# Patient Record
Sex: Male | Born: 1965 | Race: White | Hispanic: No | Smoking: Never smoker
Health system: Southern US, Community
[De-identification: ages and names within clinical notes are randomized; demographics above are authoritative.]

## PROBLEM LIST (undated history)

## (undated) DIAGNOSIS — F32A Depression, unspecified: Secondary | ICD-10-CM

## (undated) DIAGNOSIS — M5137 Other intervertebral disc degeneration, lumbosacral region: Secondary | ICD-10-CM

## (undated) DIAGNOSIS — M51379 Other intervertebral disc degeneration, lumbosacral region without mention of lumbar back pain or lower extremity pain: Secondary | ICD-10-CM

## (undated) DIAGNOSIS — F329 Major depressive disorder, single episode, unspecified: Secondary | ICD-10-CM

## (undated) HISTORY — DX: Other intervertebral disc degeneration, lumbosacral region without mention of lumbar back pain or lower extremity pain: M51.379

## (undated) HISTORY — PX: APPENDECTOMY: SHX54

## (undated) HISTORY — DX: Depression, unspecified: F32.A

## (undated) HISTORY — DX: Major depressive disorder, single episode, unspecified: F32.9

## (undated) HISTORY — DX: Other intervertebral disc degeneration, lumbosacral region: M51.37

---

## 2010-07-22 ENCOUNTER — Emergency Department: Payer: Self-pay | Admitting: Emergency Medicine

## 2010-10-10 ENCOUNTER — Ambulatory Visit: Payer: Self-pay | Admitting: Pain Medicine

## 2010-10-25 ENCOUNTER — Ambulatory Visit: Payer: Self-pay | Admitting: Pain Medicine

## 2010-11-23 ENCOUNTER — Ambulatory Visit: Payer: Self-pay | Admitting: Pain Medicine

## 2010-12-25 ENCOUNTER — Ambulatory Visit: Payer: Self-pay | Admitting: Pain Medicine

## 2011-01-22 ENCOUNTER — Ambulatory Visit: Payer: Self-pay | Admitting: Pain Medicine

## 2011-02-26 ENCOUNTER — Ambulatory Visit: Payer: Self-pay | Admitting: Pain Medicine

## 2011-03-26 ENCOUNTER — Ambulatory Visit: Payer: Self-pay | Admitting: Pain Medicine

## 2012-06-12 ENCOUNTER — Emergency Department: Payer: Self-pay | Admitting: Emergency Medicine

## 2012-06-12 LAB — URINALYSIS, COMPLETE
Bacteria: NONE SEEN
Bilirubin,UR: NEGATIVE
Blood: NEGATIVE
Ketone: NEGATIVE
Leukocyte Esterase: NEGATIVE
Protein: NEGATIVE
Specific Gravity: 1.018 (ref 1.003–1.030)
Squamous Epithelial: NONE SEEN
WBC UR: 2 /HPF (ref 0–5)

## 2012-06-12 LAB — COMPREHENSIVE METABOLIC PANEL
Alkaline Phosphatase: 75 U/L (ref 50–136)
BUN: 12 mg/dL (ref 7–18)
Bilirubin,Total: 0.8 mg/dL (ref 0.2–1.0)
Calcium, Total: 9.9 mg/dL (ref 8.5–10.1)
Chloride: 105 mmol/L (ref 98–107)
Co2: 29 mmol/L (ref 21–32)
Creatinine: 0.87 mg/dL (ref 0.60–1.30)
EGFR (African American): >60
EGFR (Non-African Amer.): >60
Glucose: 115 mg/dL — ABNORMAL HIGH (ref 65–99)
Osmolality: 280 (ref 275–301)
Potassium: 3.9 mmol/L (ref 3.5–5.1)
SGOT(AST): 21 U/L (ref 15–37)
SGPT (ALT): 26 U/L (ref 12–78)
Total Protein: 7.5 g/dL (ref 6.4–8.2)

## 2012-06-12 LAB — CBC
HCT: 43.8 % (ref 40.0–52.0)
HGB: 14.9 g/dL (ref 13.0–18.0)
MCH: 30 pg (ref 26.0–34.0)
MCHC: 34.1 g/dL (ref 32.0–36.0)
MCV: 88 fL (ref 80–100)
Platelet: 284 10*3/uL (ref 150–440)
RBC: 4.98 10*6/uL (ref 4.40–5.90)
RDW: 12.6 % (ref 11.5–14.5)
WBC: 17.4 10*3/uL — ABNORMAL HIGH (ref 3.8–10.6)

## 2012-06-12 LAB — LIPASE, BLOOD: Lipase: 107 U/L (ref 73–393)

## 2013-10-17 IMAGING — CR DG ABDOMEN 1V
1 series · 2 of 2 positions shown · non-contrast
Comparison: none

REASON FOR EXAM: constipation abd pain
COMMENTS:

PROCEDURE:     DXR - DXR KIDNEY URETER BLADDER  - June 12, 2012  [DATE]
RESULT:     History: Pain and constipation.
Comparison Study: No prior.

[Series 1: ap · 0.17mm/px · 2 of 2 slices shown]
[im 1/2]
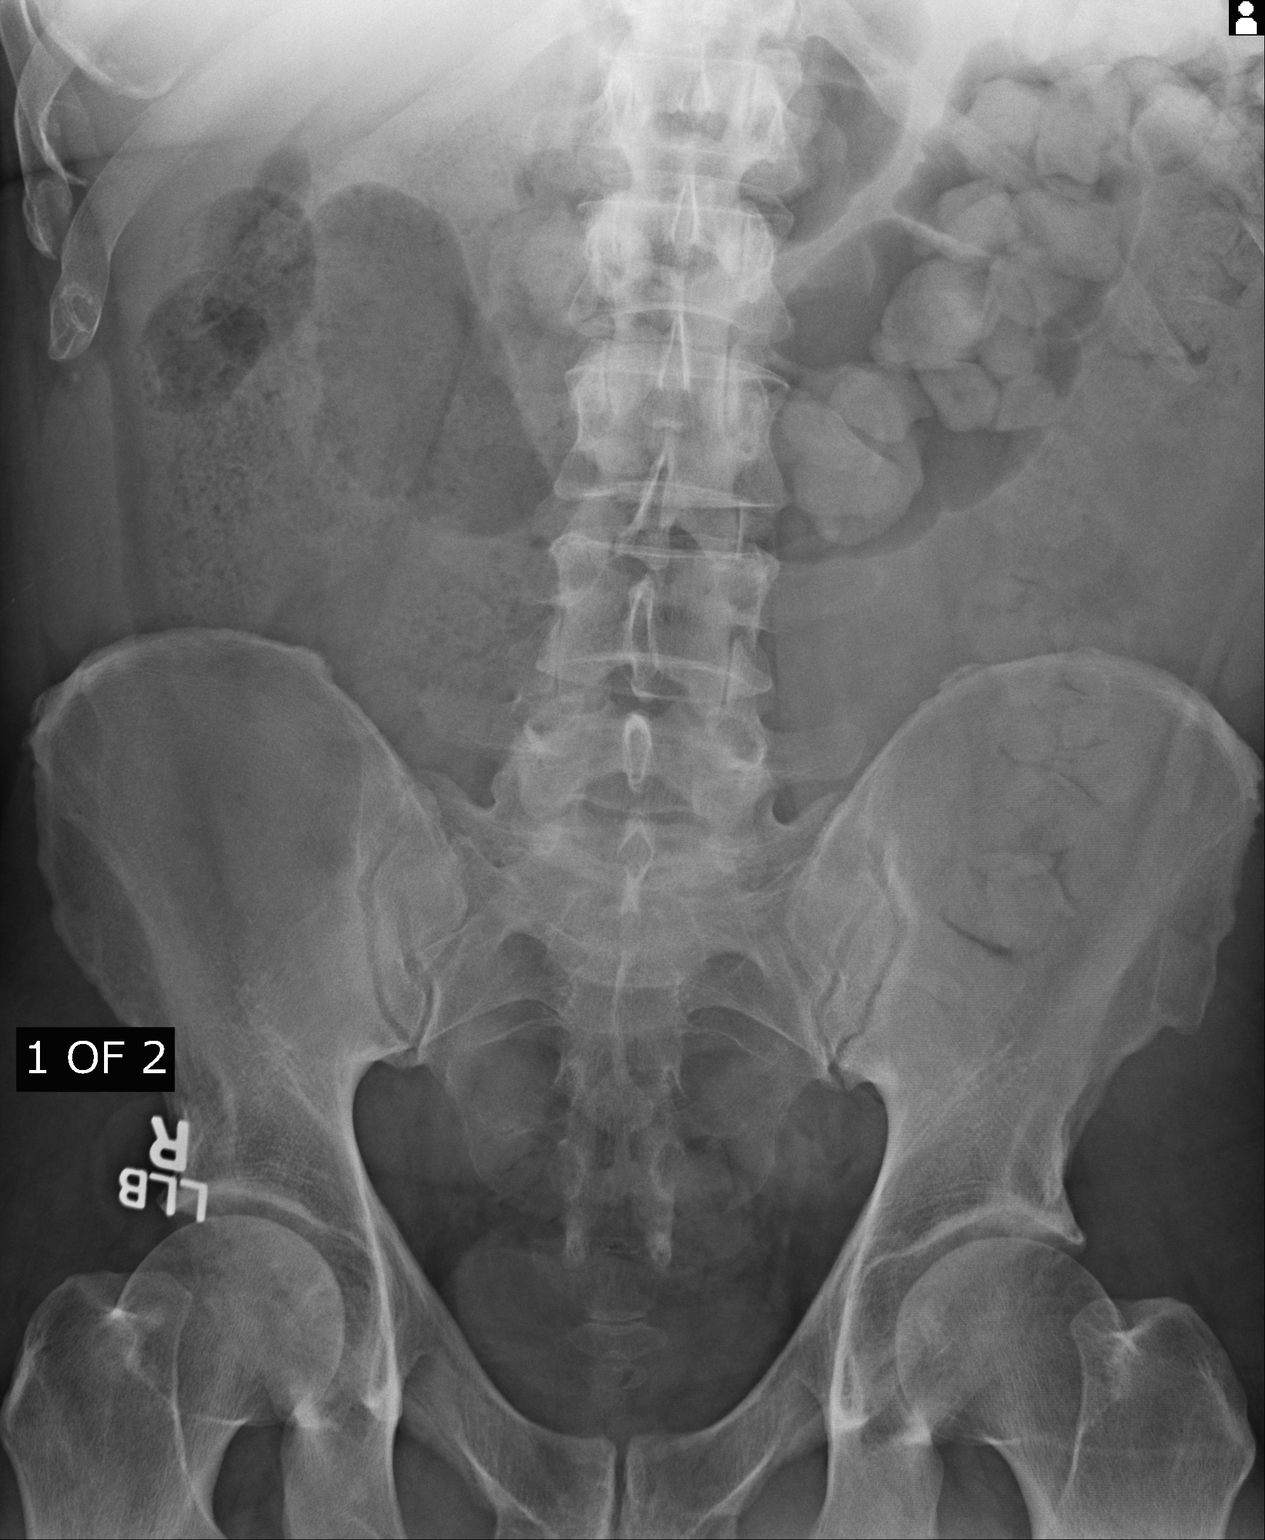
[im 2/2]
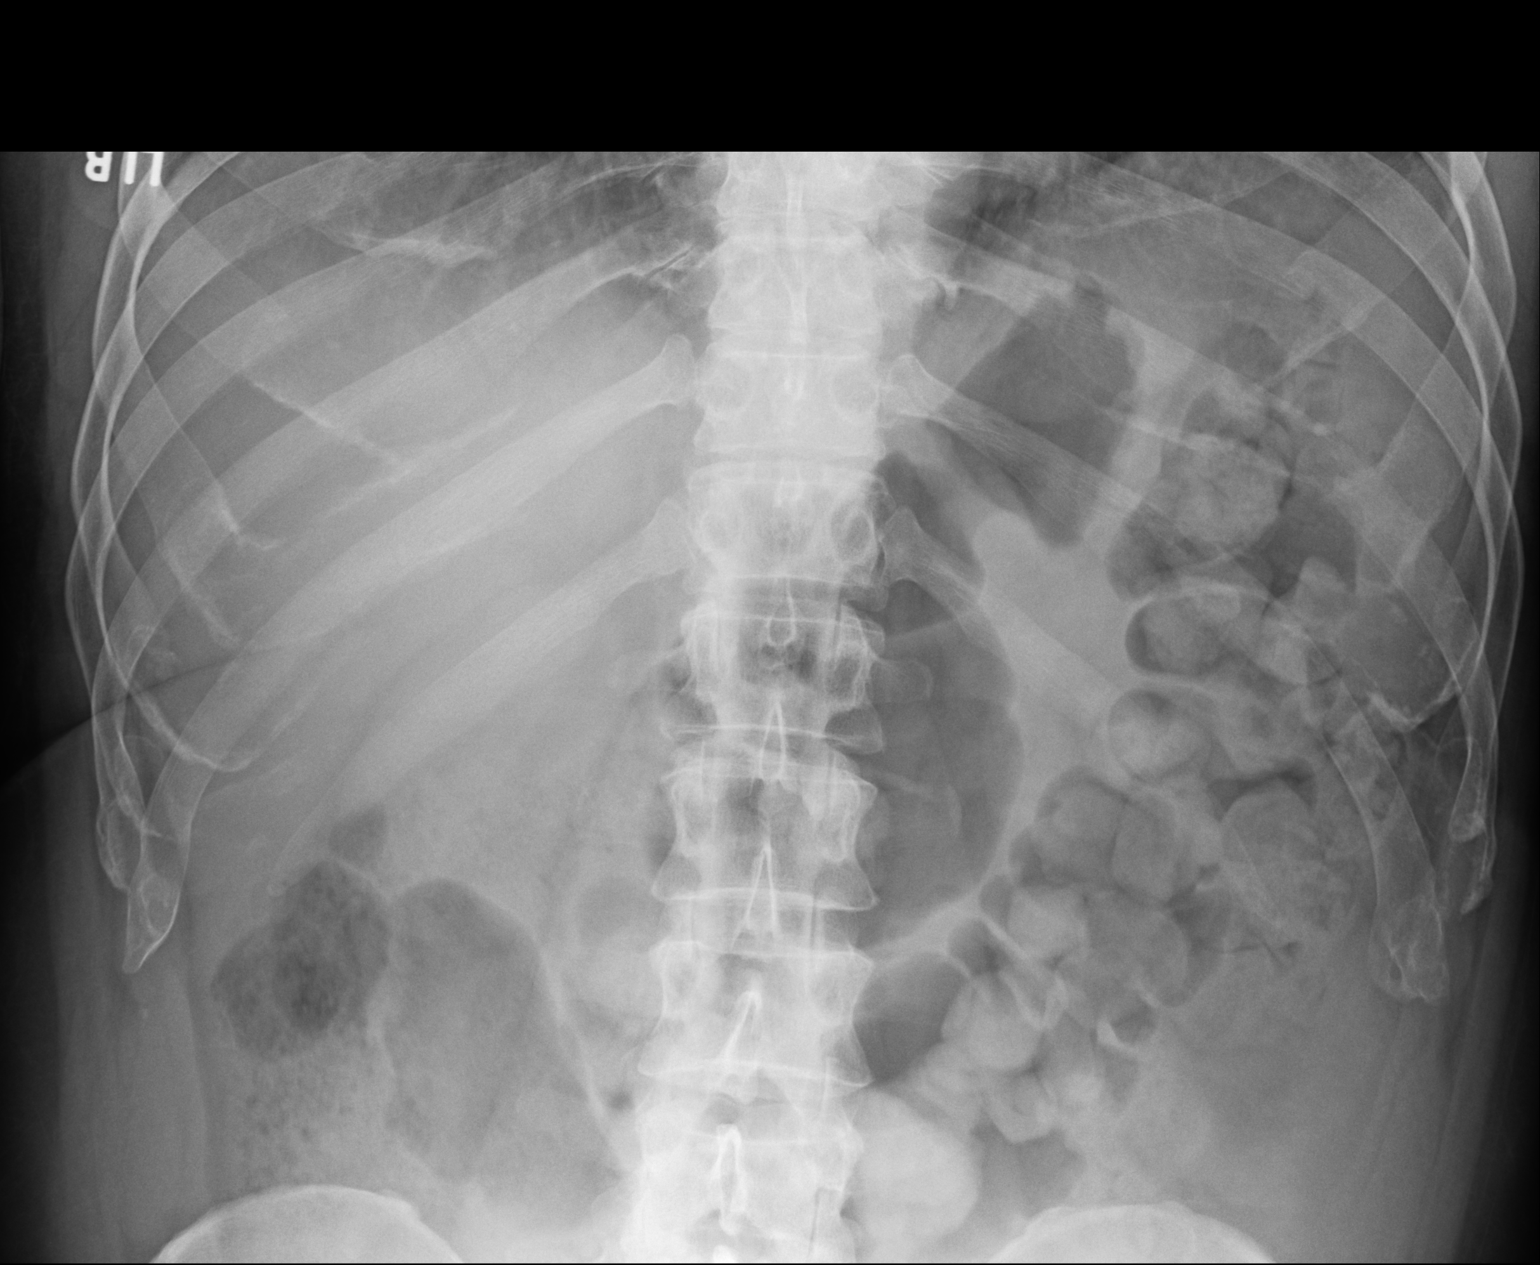

[2 of 2 positions shown; findings below may reference images not displayed]

FINDINGS: Stool is noted throughout the colon. No bowel distention. No free
air. No pathologic calcifications. No acute bony abnormality.
IMPRESSION: Large amount stool noted throughout the colon. No bowel
distention or free air.

## 2014-08-17 ENCOUNTER — Encounter (HOSPITAL_COMMUNITY): Payer: Self-pay | Admitting: Emergency Medicine

## 2014-08-17 DIAGNOSIS — F1110 Opioid abuse, uncomplicated: Secondary | ICD-10-CM | POA: Insufficient documentation

## 2014-08-17 DIAGNOSIS — M791 Myalgia: Secondary | ICD-10-CM | POA: Insufficient documentation

## 2014-08-17 NOTE — ED Notes (Signed)
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 Last Percocet this morning.

## 2014-08-18 ENCOUNTER — Emergency Department (HOSPITAL_COMMUNITY)
Admission: EM | Admit: 2014-08-18 | Discharge: 2014-08-18 | Disposition: A | Discharge status: Home / Self Care | Payer: Self-pay | Attending: Emergency Medicine | Admitting: Emergency Medicine

## 2014-08-18 DIAGNOSIS — F1110 Opioid abuse, uncomplicated: Secondary | ICD-10-CM

## 2014-08-18 NOTE — Discharge Instructions (Signed)
Opioid Withdrawal °Opioids are a group of narcotic drugs. They include the street drug heroin. They also include pain medicines, such as morphine, hydrocodone, oxycodone, and fentanyl. Opioid withdrawal is a group of characteristic physical and mental signs and symptoms. It typically occurs if you have been using opioids daily for several weeks or longer and stop using or rapidly decrease use. Opioid withdrawal can also occur if you have used opioids daily for a long time and are given a medicine to block the effect.  °SIGNS AND SYMPTOMS °Opioid withdrawal includes three or more of the following symptoms:  °· Depressed, anxious, or irritable mood. °· Nausea or vomiting. °· Muscle aches or spasms.   °· Watery eyes.    °· Runny nose. °· Dilated pupils, sweating, or hairs standing on end. °· Diarrhea or intestinal cramping. °· Yawning.   °· Fever. °· Increased blood pressure. °· Fast pulse. °· Restlessness or trouble sleeping. °These signs and symptoms occur within several hours of stopping or reducing short-acting opioids, such as heroin. They can occur within 3 days of stopping or reducing long-acting opioids, such as methadone. Withdrawal begins within minutes of receiving a drug that blocks the effects of opioids, such as naltrexone or naloxone. °DIAGNOSIS  °Opioid use disorder is diagnosed by your health care provider. You will be asked about your symptoms, drug and alcohol use, medical history, and use of medicines. A physical exam may be done. Lab tests may be ordered. Your health care provider may have you see a mental health professional.  °TREATMENT  °The treatment for opioid withdrawal is usually provided by medical doctors with special training in substance use disorders (addiction specialists). The following medicines may be included in treatment: °· Opioids given in place of the abused opioid. They turn on opioid receptors in the brain and lessen or prevent withdrawal symptoms. They are gradually  decreased (opioid substitution and taper). °· Non-opioids that can lessen certain opioid withdrawal symptoms. They may be used alone or with opioid substitution and taper. °Successful long-term recovery usually requires medicine, counseling, and group support. °HOME CARE INSTRUCTIONS  °· Take medicines only as directed by your health care provider. °· Check with your health care provider before starting new medicines. °· Keep all follow-up visits as directed by your health care provider. °SEEK MEDICAL CARE IF: °· You are not able to take your medicines as directed. °· Your symptoms get worse. °· You relapse. °SEEK IMMEDIATE MEDICAL CARE IF: °· You have serious thoughts about hurting yourself or others. °· You have a seizure. °· You lose consciousness. °Document Released: 02/08/2003 Document Revised: 06/22/2013 Document Reviewed: 02/18/2013 °ExitCare® Patient Information ©2015 [employer]. This information is not intended to replace advice given to you by your health care provider. Make sure you discuss any questions you have with your health care provider. ° °Emergency Department Resource Guide °1) Find a Doctor and Pay Out of Pocket °Although you won't have to find out who is covered by your insurance plan, it is a good idea to ask around and get recommendations. You will then need to call the office and see if the doctor you have chosen will accept you as a new patient and what types of options they offer for patients who are self-pay. Some doctors offer discounts or will set up payment plans for their patients who do not have insurance, but you will need to ask so you aren't surprised when you get to your appointment. ° °2) Contact Your Local Health Department °Not all health departments have   doctors that can see patients for sick visits, but many do, so it is worth a call to see if yours does. If you don't know where your local health department is, you can check in your phone book. The CDC also has a tool to  help you locate your state's health department, and many state websites also have listings of all of their local health departments. ° °3) Find a Walk-in Clinic °If your illness is not likely to be very severe or complicated, you may want to try a walk in clinic. These are popping up all over the country in pharmacies, drugstores, and shopping centers. They're usually staffed by nurse practitioners or physician assistants that have been trained to treat common illnesses and complaints. They're usually fairly quick and inexpensive. However, if you have serious medical issues or chronic medical problems, these are probably not your best option. ° °No Primary Care Doctor: °- Call [facility] at  832-8000 - they can help you locate a primary care doctor that  accepts your insurance, provides certain services, etc. °- Physician Referral Service- 1-800-533-3463 ° °Chronic Pain Problems: °Organization         Address  Phone   Notes  °[facility]  (336) 297-2271 Patients need to be referred by their primary care doctor.  ° °Medication Assistance: °Organization         Address  Phone   Notes  °[place] [facility] [address] °[place], [place] [address] (336) 641-8030 --Must be a resident of [place] °-- Must have NO insurance coverage whatsoever (no Medicaid/ Medicare, etc.) °-- The pt. MUST have a primary care doctor that directs their care regularly and follows them in the community °  °[facility]  (866) 331-1348   °[employer]  (888) 892-1162   ° °Agencies that provide inexpensive medical care: °Organization         Address  Phone   Notes  °[facility]  (336) 832-8035   °[facility]    (336) 832-7272   °[facility] [address] °[place], [place] [address] (336) 832-4777   °[facility] [address], °[place] (336) 271-4999   °[facility]    (336) 373-0678   °[facility]    (336) 272-1050   °[facility] ° [address], [place] Phone:  (336) 832-4444, Fax:  (336) 832-4440 Hours of Operation:  9 am - 6 pm, M-F.  Also accepts Medicaid/Medicare and self-pay.  °[facility] ° [address], [place] Phone: (336) 832-3150, Fax: (336) 832-3151. Hours of Operation:  8:30 am - 5:30 pm, M-F.  Also accepts Medicaid and self-pay.  °[facility] [place] [address], [place] Phone: (336) 878-6027   °[facility] [address], [place], [place] (336)723-1848, Ext. 123 Mondays & Thursdays: 7-9 AM.  First 15 patients are seen on a first come, first serve basis. °  ° °Medicaid-accepting [place] Providers: ° °Organization         Address  Phone   Notes  °[facility] [address], [place] (336) 641-2100 Also accepts self-pay patients.  °[facility] [address], [place] ° (336) 856-9996   °[facility] [address], [place] (336) 288-8857   °[facility] [address]   [place] [address], [place] (336) 299-7000   °Veita Bland [address], [place]  ° (336) 373-1557 Only accepts [place] Access Medicaid patients after they have their name applied to their card.  ° °Self-Pay (no insurance) in [place]: ° °Organization         Address  Phone   Notes  °Sickle Cell Patients, [facility] [address], [place] (336) 832-1970   °[facility] [address], [place] (336) 832-4400   °[facility] [place] ° [address],  (336) 992-4800   °[facility]/Dr. Osei-Bonsu ° [address] [place] [address], [place] or [address], [place] (336) 841-8500 Phone number for both [place] and [place] locations is the same.  °[facility] [address],  [place] (336) 299-0000   °[facility] [address] [place] [address], [place] or [address] (336) 852-7530 °(336) 878-2260   °[facility] [address], [place] (336) 350-1642, phone; (336) 294-5005, fax Sees patients 1st and 3rd Saturday of every month.  Must not qualify for public or private insurance (i.e. Medicaid, Medicare, [place] Health Choice, Veterans' Benefits) • Household income should be no more than 200% of the poverty level •The clinic cannot treat you if you are pregnant or think you are pregnant • Sexually transmitted diseases are not treated at the clinic.  ° ° °Dental Care: °Organization         Address  Phone  Notes  °[facility] [facility] [address], [place] (336) 641-6152 Accepts children up to age 21 who are enrolled in Medicaid or [place] Health Choice; pregnant women with a Medicaid card; and children who have applied for Medicaid or [place] Health Choice, but were declined, whose parents can pay a reduced fee at time of service.  °[facility] [place]  [address], [place] (336) 641-7733 Accepts children up to age 21 who are enrolled in Medicaid or [place] Health Choice; pregnant women with a Medicaid card; and children who have applied for Medicaid or  Health Choice, but were declined, whose parents can pay a reduced fee at time of service.  °[facility] ° [address], [place] (336) 641-4533 Patients are seen by appointment only. Walk-ins are not accepted. [facility] will see patients 18 years of age and older. °Monday - Tuesday (8am-5pm) °Most Wednesdays (8:30-5pm) °$30 per visit, cash only  °[facility] ° [address], [place] (336) 641-4533 Patients are seen by appointment only. Walk-ins are not accepted. [facility] will see patients 18 years of age and older. °One Wednesday Evening  (Monthly: Volunteer Based).  $30 per visit, cash only  °[facility]  (919) 537-3737 for adults; Children under age 4, call Graduate Pediatric Dentistry at (919) [phone]. Children aged 4-14, please call (919) [phone] to request a pediatric application. ° Dental services are provided in all areas of dental care including fillings, crowns and bridges, complete and partial dentures, implants, gum treatment, root canals, and extractions. Preventive care is also provided. Treatment is provided to both adults and children. °Patients are selected via a lottery and there is often a waiting list. °  °[facility] [address], °[place] ° (336) 763-8833 [email or web address] °  °[facility] [address], [place], [place] (336)723-1848, Ext. 123 Second and Fourth Thursday of each month, opens at 6:30 AM; Clinic ends at 9 AM.  Patients are seen on a first-come first-served basis, and a limited number are seen during each clinic.  ° °[facility] ° [address], [place], [place] (336) 723-7904   Eligibility Requirements °You must have lived in [place], [place], or [place] counties for at least the last three months. °  You cannot be eligible for state or federal sponsored healthcare insurance, including Veterans Administration, Medicaid, or Medicare. °  You generally cannot be eligible for healthcare insurance through your employer.  °  How to apply: °Eligibility screenings are held every Tuesday and Wednesday afternoon from 1:00 pm until 4:00 pm. You do not need an appointment for the interview!  °[facility] [address], [place], [place] 336-631-2330   °[facility]  336-342-8273   °[facility]  336-703-3100   °[facility]  336-570-6415   ° °Behavioral Health Resources in the Community: °Intensive Outpatient Programs °Organization         Address  Phone  Notes  °[place]  [facility] [address], [place], [place] 336-878-6098   °[facility] [address], [place], [place] 336-832-9800   °[facility] [address], [place], [place] ° 336-882-2125   °[facility] [address],  °[place], [place] 1-800-853-5163 or 336-641-4981   °Substance Abuse Resources °Organization         Address  Phone  Notes  °[facility]  336-882-2125   °[facility]  336-784-9470   °[facility]  336-285-9073   °[facility]  336-845-3988   °Residential & Outpatient Substance Abuse Program  1-800-659-3381   °Psychological Services °Organization         Address  Phone  Notes  °[facility]  336- 832-9600   °[facility]  336- 378-7881   °[facility] [address], [place] 1-800-853-5163 or 336-641-4981   ° °Mobile Crisis Teams °Organization         Address  Phone  Notes  °[facility], Mobile Crisis Care Unit  1-877-626-1772   °Assertive °[facility] ° [address] [place], [place] 336-834-9664   °Sharon DeEsch [address] °[place] 336-554-5454   ° °Self-Help/Support Groups °Organization         Address  Phone             Notes  °[facility] - variety of support groups  336- 373-1402 Call for more information  °Narcotics Anonymous (NA), [facility] [address], °[place]  2 meetings at this location  ° °Residential Treatment Programs °Organization         Address  Phone  Notes  °[facility] [address],    °[place]  1-866-801-8205   °[facility] ° [address], [place], [place] 704-293-8524   °[facility] [address], [place] 336-845-3988 Admissions: 8am-3pm M-F  °[facility] [address]    °[place], [place] 336-841-1104   °[facility] [address],  [place], [place] 336-379-7146   °[facility] [address]  °[place],  336-285-9073   °[facility] - Intensive Outpatient [address], ,   [place] 336-852-3033   °[facility] [address]  °[place], [place] 1-877-615-2722 or 336-784-9470   °[facility] [address] [place], [place] 336-227-7417 Accepts Medicaid  °[facility] [address]  °[place] 1-800-659-3381 Substance Abuse/Addiction Treatment  ° °[place] Behavioral Health Resources °Organization         Address  Phone  Notes  °[facility]  (888) 581-9988   °Julie Brannon, PhD [address] [place], [place]   (336) 349-5553 or (336) 951-0000   °[facility]   [address] °[place], [place] (336) 349-4454   °[facility] [address], [place], [place] (336) 342-8316 Insurance/Medicaid/sponsorship through [facility]  °[facility] [address]                                    [place], [place] (336) 342-8316 Therapy/tele-psych/case  °[facility] [address]  ° [place], [place] (336) 349-2233    °Dr. Arfeen  (336) 349-4544   °[facility] [place]  [employer] [place] [facility] 1) [address], [place] °2) [address], [place] °3)  [address], [place] (336) 349-3220 °(336) 342-7768 ° °(336) 342-8140   °[place] [facility] (336) 342-1394 or (336) 342-3537 (After Hours)    ° ° °

## 2014-08-18 NOTE — ED Provider Notes (Signed)
CSN: [id number]     Arrival date & time 08/17/14  2259 History  This chart was scribed for Shon Batonourtney F Anatalia Kronk, MD by Annye AsaAnna Dorsett, ED Scribe. This patient was seen in room D33C/D33C and the patient's care was started at 1:36 AM.    Chief Complaint  Patient presents with  . Drug Problem   The history is provided by the patient. No language interpreter was used.     HPI Comments: Christopher Vaughn is an otherwise healthy 49 y.o. male who presents to the Emergency Department requesting detox from opiates. Patient reports 5 years of Percocet abuse; last use this morning. He states he has decided to get clean "for family." He has never before attempted detox. He reports chills, generalized myalgias beginning today. He denies SI, HI.   He does not take any daily medications; no known drug allergies.  He also reports cocaine use. He denies EtOH use.   History reviewed. No pertinent past medical history. History reviewed. No pertinent past surgical history. No family history on file. History  Substance Use Topics  . Smoking status: Never Smoker   . Smokeless tobacco: Not on file  . Alcohol Use: No    Review of Systems  Constitutional: Negative.  Negative for fever.  Respiratory: Negative.  Negative for chest tightness and shortness of breath.   Cardiovascular: Negative.  Negative for chest pain.  Gastrointestinal: Negative.  Negative for nausea, abdominal pain and diarrhea.  Genitourinary: Negative.  Negative for dysuria.  Musculoskeletal: Positive for myalgias. Negative for back pain.  Neurological: Negative for seizures and syncope.  Psychiatric/Behavioral: Negative for suicidal ideas.  All other systems reviewed and are negative.     Allergies  Review of patient's allergies indicates no known allergies.  Home Medications   Prior to Admission medications   Not on File   BP 142/97 mmHg  Pulse 91  Temp(Src) 98.1 F (36.7 C) (Oral)  Resp 23  Ht 6' (1.829 m)  Wt 220 lb (99.791  kg)  BMI 29.83 kg/m2  SpO2 94% Physical Exam  Constitutional: He is oriented to person, place, and time. He appears well-developed and well-nourished. No distress.  HENT:  Head: Normocephalic and atraumatic.  Cardiovascular: Normal rate, regular rhythm and normal heart sounds.   No murmur heard. Pulmonary/Chest: Effort normal and breath sounds normal. No respiratory distress. He has no wheezes.  Abdominal: Soft. Bowel sounds are normal. There is no tenderness. There is no rebound.  Musculoskeletal: He exhibits no edema.  Neurological: He is alert and oriented to person, place, and time.  Skin: Skin is warm and dry.  Psychiatric: He has a normal mood and affect.  Nursing note and vitals reviewed.   ED Course  Procedures   DIAGNOSTIC STUDIES: Oxygen Saturation is 94% on RA, low by my interpretation.    COORDINATION OF CARE: 2:26 AM Discussed treatment plan with pt at bedside and pt agreed to plan.   Labs Review Labs Reviewed - No data to display  Imaging Review No results found.   EKG Interpretation None      MDM   Final diagnoses:  Opioid abuse   Patient presents requesting opiate detox. Nontoxic on exam. Vital signs reassuring.  Reports generalized myalgias but no additional withdrawal symptoms. Provided patient with outpatient resources for opioid detox and management.  After history, exam, and medical workup I feel the patient has been appropriately medically screened and is safe for discharge home. Pertinent diagnoses were discussed with the patient. Patient was given  return precautions.  I personally performed the services described in this documentation, which was scribed in my presence. The recorded information has been reviewed and is accurate.      Shon Baton, MD 08/18/14 [phone]

## 2015-04-01 ENCOUNTER — Telehealth: Payer: Self-pay | Admitting: Family Medicine

## 2015-04-29 NOTE — Telephone Encounter (Signed)
done

## 2015-05-02 ENCOUNTER — Encounter: Payer: Self-pay | Admitting: Family Medicine

## 2015-05-02 ENCOUNTER — Ambulatory Visit (INDEPENDENT_AMBULATORY_CARE_PROVIDER_SITE_OTHER): Payer: Self-pay | Admitting: Family Medicine

## 2015-05-02 VITALS — BP 128/77 | HR 77 | Temp 97.9°F | Ht 71.8 in | Wt 211.0 lb

## 2015-05-02 DIAGNOSIS — M5137 Other intervertebral disc degeneration, lumbosacral region: Secondary | ICD-10-CM | POA: Insufficient documentation

## 2015-05-02 DIAGNOSIS — Z0000 Encounter for general adult medical examination without abnormal findings: Secondary | ICD-10-CM

## 2015-05-02 DIAGNOSIS — Z113 Encounter for screening for infections with a predominantly sexual mode of transmission: Secondary | ICD-10-CM

## 2015-05-02 DIAGNOSIS — R300 Dysuria: Secondary | ICD-10-CM

## 2015-05-02 LAB — URINALYSIS, ROUTINE W REFLEX MICROSCOPIC
Bilirubin, UA: NEGATIVE
Glucose, UA: NEGATIVE
Ketones, UA: NEGATIVE
Nitrite, UA: NEGATIVE
PH UA: 5 (ref 5.0–7.5)
Protein, UA: NEGATIVE
Specific Gravity, UA: >1.03 — ABNORMAL HIGH (ref 1.005–1.030)
Urobilinogen, Ur: 0.2 mg/dL (ref 0.2–1.0)

## 2015-05-02 LAB — MICROSCOPIC EXAMINATION

## 2015-05-02 MED ORDER — DULOXETINE HCL 60 MG PO CPEP: 60.0000 mg | ORAL_CAPSULE | Freq: Every day | ORAL | Status: AC

## 2015-05-02 MED ORDER — TADALAFIL 20 MG PO TABS: 10.0000 mg | ORAL_TABLET | ORAL | Status: AC | PRN

## 2015-05-02 MED ORDER — DULOXETINE HCL 30 MG PO CPEP: 30.0000 mg | ORAL_CAPSULE | Freq: Every day | ORAL | Status: AC

## 2015-05-02 MED ORDER — PREDNISONE 10 MG PO TABS
ORAL_TABLET | ORAL | Status: AC
Start: 2015-05-02 — End: ?

## 2015-05-02 NOTE — Addendum Note (Signed)
Addended byVonita Moss: Meiya Wisler on: 05/02/2015 11:43 AM   Modules accepted: Orders, SmartSet

## 2015-05-02 NOTE — Assessment & Plan Note (Addendum)
Stable will give predinsone to hold for a bad episode understanding limited dosing and use For back pain we will try Cymbalta causes difficulty getting in pain clinics and other pain medications May help with stress also.

## 2015-05-02 NOTE — Progress Notes (Signed)
BP 128/77 mmHg  Pulse 77  Temp(Src) 97.9 F (36.6 C)  Ht 5' 11.8" (1.824 m)  Wt 211 lb (95.709 kg)  BMI 28.77 kg/m2  SpO2 97%   Subjective:    Patient ID: Christopher Vaughn, male    DOB: May 12, 1965, 50 y.o.   MRN: [id number]  HPI: Christopher Vaughn is a 50 y.o. male  Chief Complaint  Patient presents with  . Annual Exam   Patient with a lot of new changes going on in life with stress with recent separation, new job change Ongoing chronic back pain with some lower leg numbness tingling occasionally but all in all is been doing better and been able to run and exercise. Has had good weight loss with being able to exercise.  Relevant past medical, surgical, family and social history reviewed and updated as indicated. Interim medical history since our last visit reviewed. Allergies and medications reviewed and updated.  Review of Systems  Constitutional: Negative.   HENT: Negative.   Eyes: Negative.   Respiratory: Negative.   Cardiovascular: Negative.   Gastrointestinal: Negative.   Endocrine: Negative.   Genitourinary: Negative.   Musculoskeletal: Negative.   Skin: Negative.   Allergic/Immunologic: Negative.   Neurological: Negative.   Hematological: Negative.   Psychiatric/Behavioral: Negative.     Per HPI unless specifically indicated above     Objective:    BP 128/77 mmHg  Pulse 77  Temp(Src) 97.9 F (36.6 C)  Ht 5' 11.8" (1.824 m)  Wt 211 lb (95.709 kg)  BMI 28.77 kg/m2  SpO2 97%  Wt Readings from Last 3 Encounters:  05/02/15 211 lb (95.709 kg)  09/03/13 219 lb (99.338 kg)  08/17/14 220 lb (99.791 kg)    Physical Exam  Constitutional: He is oriented to person, place, and time. He appears well-developed and well-nourished.  HENT:  Head: Normocephalic and atraumatic.  Right Ear: External ear normal.  Left Ear: External ear normal.  Eyes: Conjunctivae and EOM are normal. Pupils are equal, round, and reactive to light.  Neck: Normal range of motion. Neck  supple.  Cardiovascular: Normal rate, regular rhythm, normal heart sounds and intact distal pulses.   Pulmonary/Chest: Effort normal and breath sounds normal.  Abdominal: Soft. Bowel sounds are normal. There is no splenomegaly or hepatomegaly.  Genitourinary: Rectum normal, prostate normal and penis normal.  Musculoskeletal: Normal range of motion.  Neurological: He is alert and oriented to person, place, and time. He has normal reflexes.  Skin: No rash noted. No erythema.  Psychiatric: He has a normal mood and affect. His behavior is normal. Judgment and thought content normal.        Assessment & Plan:   Problem List Items Addressed This Visit      Musculoskeletal and Integument   DDD (degenerative disc disease), lumbosacral    Stable will give predinsone to hold for a bad episode understanding limited dosing and use For back pain we will try Cymbalta causes difficulty getting in pain clinics and other pain medications May help with stress also.      Relevant Medications   predniSONE (DELTASONE) 10 MG tablet    Other Visit Diagnoses    Routine general medical examination at a health care facility    -  Primary    Relevant Orders    CBC with Differential/Platelet    Comprehensive metabolic panel    Lipid Panel w/o Chol/HDL Ratio    PSA    TSH    Urinalysis, Routine w reflex microscopic (  [facility])    Routine screening for STI (sexually transmitted infection)        Relevant Orders    HIV antibody        Follow up plan: Return in about 4 weeks (around 05/30/2015) for CYMBALTA CHECK.

## 2015-05-03 ENCOUNTER — Encounter: Payer: Self-pay | Admitting: Family Medicine

## 2015-05-03 LAB — CBC WITH DIFFERENTIAL/PLATELET
BASOS: 1 %
Basophils Absolute: 0 10*3/uL (ref 0.0–0.2)
EOS (ABSOLUTE): 0.2 10*3/uL (ref 0.0–0.4)
Eos: 4 %
Hematocrit: 42.7 % (ref 37.5–51.0)
Hemoglobin: 14.2 g/dL (ref 12.6–17.7)
IMMATURE GRANS (ABS): 0 10*3/uL (ref 0.0–0.1)
IMMATURE GRANULOCYTES: 1 %
LYMPHS: 21 %
Lymphocytes Absolute: 1.2 10*3/uL (ref 0.7–3.1)
MCH: 30.2 pg (ref 26.6–33.0)
MCHC: 33.3 g/dL (ref 31.5–35.7)
MCV: 91 fL (ref 79–97)
Monocytes Absolute: 0.6 10*3/uL (ref 0.1–0.9)
Monocytes: 10 %
NEUTROS PCT: 63 %
Neutrophils Absolute: 3.8 10*3/uL (ref 1.4–7.0)
Platelets: 303 10*3/uL (ref 150–379)
RBC: 4.7 x10E6/uL (ref 4.14–5.80)
RDW: 13.7 % (ref 12.3–15.4)
WBC: 5.8 10*3/uL (ref 3.4–10.8)

## 2015-05-03 LAB — LIPID PANEL W/O CHOL/HDL RATIO
Cholesterol, Total: 200 mg/dL — ABNORMAL HIGH (ref 100–199)
HDL: 39 mg/dL — AB (ref >39)
LDL Calculated: 106 mg/dL — ABNORMAL HIGH (ref 0–99)
Triglycerides: 273 mg/dL — ABNORMAL HIGH (ref 0–149)
VLDL CHOLESTEROL CAL: 55 mg/dL — AB (ref 5–40)

## 2015-05-03 LAB — COMPREHENSIVE METABOLIC PANEL
A/G RATIO: 2 (ref 1.2–2.2)
ALT: 14 IU/L (ref 0–44)
AST: 18 IU/L (ref 0–40)
Albumin: 4.3 g/dL (ref 3.5–5.5)
Alkaline Phosphatase: 69 IU/L (ref 39–117)
BUN/Creatinine Ratio: 16 (ref 9–20)
BUN: 14 mg/dL (ref 6–24)
Bilirubin Total: 0.2 mg/dL (ref 0.0–1.2)
CO2: 23 mmol/L (ref 18–29)
CREATININE: 0.85 mg/dL (ref 0.76–1.27)
Calcium: 9.5 mg/dL (ref 8.7–10.2)
Chloride: 98 mmol/L (ref 96–106)
GFR calc non Af Amer: 102 mL/min/{1.73_m2} (ref >59)
GFR, EST AFRICAN AMERICAN: 118 mL/min/{1.73_m2} (ref >59)
GLUCOSE: 87 mg/dL (ref 65–99)
Globulin, Total: 2.1 g/dL (ref 1.5–4.5)
Potassium: 4.4 mmol/L (ref 3.5–5.2)
Sodium: 142 mmol/L (ref 134–144)
TOTAL PROTEIN: 6.4 g/dL (ref 6.0–8.5)

## 2015-05-03 LAB — TSH: TSH: 1.65 u[IU]/mL (ref 0.450–4.500)

## 2015-05-03 LAB — URINE CULTURE

## 2015-05-03 LAB — HIV ANTIBODY (ROUTINE TESTING W REFLEX): HIV SCREEN 4TH GENERATION: NONREACTIVE

## 2015-05-03 LAB — PSA: PROSTATE SPECIFIC AG, SERUM: 0.7 ng/mL (ref 0.0–4.0)

## 2022-03-23 ENCOUNTER — Other Ambulatory Visit: Payer: BC Managed Care – PPO

## 2022-05-04 ENCOUNTER — Ambulatory Visit: Payer: BC Managed Care – PPO

## 2022-05-04 DIAGNOSIS — Z1211 Encounter for screening for malignant neoplasm of colon: Secondary | ICD-10-CM | POA: Diagnosis not present

## 2023-02-27 ENCOUNTER — Ambulatory Visit: Payer: 59 | Admitting: Dermatology

## 2023-02-27 DIAGNOSIS — L821 Other seborrheic keratosis: Secondary | ICD-10-CM | POA: Diagnosis not present

## 2023-02-27 DIAGNOSIS — L820 Inflamed seborrheic keratosis: Secondary | ICD-10-CM | POA: Diagnosis not present

## 2023-02-27 DIAGNOSIS — L814 Other melanin hyperpigmentation: Secondary | ICD-10-CM

## 2023-02-27 NOTE — Patient Instructions (Addendum)

## 2023-02-27 NOTE — Progress Notes (Signed)
   New Patient Visit   Subjective  Christopher Vaughn is a 58 y.o. male who presents for the following: The patient has spots on his face and right shoulder to be evaluated, some may be new or changing and the patient may have concern these could be cancer.   The following portions of the chart were reviewed this encounter and updated as appropriate: medications, allergies, medical history  Review of Systems:  No other skin or systemic complaints except as noted in HPI or Assessment and Plan.  Objective  Well appearing patient in no apparent distress; mood and affect are within normal limits.  A focused examination was performed of the following areas:face, chest    Relevant exam findings are noted in the Assessment and Plan.  right clavicle x 1, right zygoma x 1 (2) Stuck-on, waxy, tan-brown papule or plaque --Discussed benign etiology and prognosis.   Assessment & Plan   INFLAMED SEBORRHEIC KERATOSIS (2) right clavicle x 1, right zygoma x 1 (2) Symptomatic, irritating, patient would like treated.  Destruction of lesion - right clavicle x 1, right zygoma x 1 (2)  Destruction method: cryotherapy   Informed consent: discussed and consent obtained   Lesion destroyed using liquid nitrogen: Yes   Region frozen until ice ball extended beyond lesion: Yes   Outcome: patient tolerated procedure well with no complications   Post-procedure details: wound care instructions given   Additional details:  Prior to procedure, discussed risks of blister formation, small wound, skin dyspigmentation, or rare scar following cryotherapy. Recommend Vaseline ointment to treated areas while healing.   SEBORRHEIC KERATOSIS - Stuck-on, waxy, tan-brown papules and/or plaques  - Benign-appearing - Discussed benign etiology and prognosis. - Observe - Call for any changes  LENTIGINES Exam: scattered tan macules Due to sun exposure Treatment Plan: Benign-appearing, observe. Recommend daily broad spectrum  sunscreen SPF 30+ to sun-exposed areas, reapply every 2 hours as needed.  Call for any changes   Return if symptoms worsen or fail to improve.  I, Fay Kirks, CMA, am acting as scribe for Rexene Rattler, MD .   Documentation: I have reviewed the above documentation for accuracy and completeness, and I agree with the above.  Rexene Rattler, MD

## 2023-08-14 ENCOUNTER — Ambulatory Visit: Payer: Self-pay | Admitting: Dermatology

## 2023-08-21 ENCOUNTER — Other Ambulatory Visit: Payer: Self-pay | Admitting: Internal Medicine

## 2023-08-21 DIAGNOSIS — R1011 Right upper quadrant pain: Secondary | ICD-10-CM

## 2023-09-18 ENCOUNTER — Ambulatory Visit
Admission: RE | Admit: 2023-09-18 | Discharge: 2023-09-18 | Disposition: A | Discharge status: Home / Self Care | Source: Ambulatory Visit | Attending: Internal Medicine | Admitting: Internal Medicine

## 2023-09-18 DIAGNOSIS — R1011 Right upper quadrant pain: Secondary | ICD-10-CM | POA: Diagnosis present
